# Patient Record
Sex: Male | Born: 1950 | Race: White | Hispanic: No | Marital: Married | Smoking: Current every day smoker
Health system: Southern US, Community
[De-identification: ages and names within clinical notes are randomized; demographics above are authoritative.]

## PROBLEM LIST (undated history)

## (undated) DIAGNOSIS — F419 Anxiety disorder, unspecified: Secondary | ICD-10-CM

## (undated) DIAGNOSIS — N400 Enlarged prostate without lower urinary tract symptoms: Secondary | ICD-10-CM

## (undated) DIAGNOSIS — D649 Anemia, unspecified: Secondary | ICD-10-CM

## (undated) DIAGNOSIS — M1990 Unspecified osteoarthritis, unspecified site: Secondary | ICD-10-CM

---

## 1988-09-27 HISTORY — PX: NASAL SEPTUM SURGERY: SHX37

## 1998-06-04 ENCOUNTER — Ambulatory Visit (HOSPITAL_COMMUNITY): Admission: RE | Admit: 1998-06-04 | Discharge: 1998-06-04 | Payer: Self-pay | Admitting: Family Medicine

## 2002-06-13 ENCOUNTER — Emergency Department (HOSPITAL_COMMUNITY): Admission: EM | Admit: 2002-06-13 | Discharge: 2002-06-13 | Payer: Self-pay | Admitting: Internal Medicine

## 2002-06-13 ENCOUNTER — Encounter: Payer: Self-pay | Admitting: Internal Medicine

## 2003-01-22 ENCOUNTER — Ambulatory Visit (HOSPITAL_COMMUNITY): Admission: RE | Admit: 2003-01-22 | Discharge: 2003-01-22 | Payer: Self-pay | Admitting: Family Medicine

## 2003-01-22 ENCOUNTER — Encounter: Payer: Self-pay | Admitting: Family Medicine

## 2008-03-21 ENCOUNTER — Ambulatory Visit (HOSPITAL_COMMUNITY): Admission: RE | Admit: 2008-03-21 | Discharge: 2008-03-21 | Payer: Self-pay | Admitting: Family Medicine

## 2009-10-28 ENCOUNTER — Ambulatory Visit (HOSPITAL_COMMUNITY): Admission: RE | Admit: 2009-10-28 | Discharge: 2009-10-28 | Payer: Self-pay | Admitting: Family Medicine

## 2009-12-30 ENCOUNTER — Ambulatory Visit (HOSPITAL_COMMUNITY): Admission: RE | Admit: 2009-12-30 | Discharge: 2009-12-30 | Payer: Self-pay | Admitting: Family Medicine

## 2010-08-05 ENCOUNTER — Ambulatory Visit (HOSPITAL_COMMUNITY): Admission: RE | Admit: 2010-08-05 | Discharge: 2010-08-05 | Payer: Self-pay | Admitting: Family Medicine

## 2010-10-19 ENCOUNTER — Encounter: Payer: Self-pay | Admitting: Orthopaedic Surgery

## 2018-01-09 ENCOUNTER — Other Ambulatory Visit (HOSPITAL_COMMUNITY): Payer: Self-pay

## 2018-01-10 ENCOUNTER — Encounter (HOSPITAL_COMMUNITY): Payer: Self-pay

## 2018-01-10 ENCOUNTER — Encounter (HOSPITAL_COMMUNITY)
Admission: RE | Admit: 2018-01-10 | Discharge: 2018-01-10 | Disposition: A | Discharge status: Home / Self Care | Payer: Medicare HMO | Source: Ambulatory Visit | Attending: Family Medicine | Admitting: Family Medicine

## 2018-01-10 DIAGNOSIS — D500 Iron deficiency anemia secondary to blood loss (chronic): Secondary | ICD-10-CM | POA: Insufficient documentation

## 2018-01-10 LAB — ABO/RH: ABO/RH(D): O POS

## 2018-01-10 LAB — PREPARE RBC (CROSSMATCH)

## 2018-01-11 ENCOUNTER — Other Ambulatory Visit (HOSPITAL_COMMUNITY): Payer: Self-pay

## 2018-01-11 ENCOUNTER — Encounter (HOSPITAL_COMMUNITY): Payer: Self-pay

## 2018-01-11 ENCOUNTER — Encounter (HOSPITAL_COMMUNITY)
Admission: RE | Admit: 2018-01-11 | Discharge: 2018-01-11 | Disposition: A | Discharge status: Home / Self Care | Payer: Medicare HMO | Source: Ambulatory Visit | Attending: Family Medicine | Admitting: Family Medicine

## 2018-01-11 DIAGNOSIS — D500 Iron deficiency anemia secondary to blood loss (chronic): Secondary | ICD-10-CM | POA: Diagnosis not present

## 2018-01-11 HISTORY — DX: Unspecified osteoarthritis, unspecified site: M19.90

## 2018-01-11 HISTORY — DX: Anemia, unspecified: D64.9

## 2018-01-11 HISTORY — DX: Benign prostatic hyperplasia without lower urinary tract symptoms: N40.0

## 2018-01-11 HISTORY — DX: Anxiety disorder, unspecified: F41.9

## 2018-01-11 MED ORDER — SODIUM CHLORIDE 0.9 % IV SOLN
Freq: Once | INTRAVENOUS | Status: AC
  Administered 2018-01-11: 09:00:00 via INTRAVENOUS

## 2018-01-11 NOTE — Progress Notes (Signed)
Patient tolerated both units of PRBS without reaction.  No complaints noted.

## 2018-01-12 LAB — BPAM RBC
BLOOD PRODUCT EXPIRATION DATE: 201905032359
Blood Product Expiration Date: 201904162359
Blood Product Expiration Date: 201905162359
ISSUE DATE / TIME: 201904170842
ISSUE DATE / TIME: 201904171055
UNIT TYPE AND RH: 5100
UNIT TYPE AND RH: 5100
Unit Type and Rh: 5100

## 2018-01-12 LAB — TYPE AND SCREEN
ABO/RH(D): O POS
ANTIBODY SCREEN: NEGATIVE
UNIT DIVISION: 0
Unit division: 0
Unit division: 0

## 2018-02-02 ENCOUNTER — Ambulatory Visit (INDEPENDENT_AMBULATORY_CARE_PROVIDER_SITE_OTHER): Payer: Medicare HMO | Admitting: Internal Medicine

## 2018-09-01 ENCOUNTER — Ambulatory Visit (HOSPITAL_COMMUNITY)
Admission: RE | Admit: 2018-09-01 | Discharge: 2018-09-01 | Disposition: A | Discharge status: Home / Self Care | Payer: Medicare HMO | Source: Ambulatory Visit | Attending: Family Medicine | Admitting: Family Medicine

## 2018-09-01 ENCOUNTER — Other Ambulatory Visit (HOSPITAL_COMMUNITY): Payer: Self-pay | Admitting: Family Medicine

## 2018-09-01 ENCOUNTER — Other Ambulatory Visit (HOSPITAL_COMMUNITY)
Admission: RE | Admit: 2018-09-01 | Discharge: 2018-09-01 | Disposition: A | Discharge status: Home / Self Care | Payer: Medicare HMO | Source: Ambulatory Visit | Attending: Family Medicine | Admitting: Family Medicine

## 2018-09-01 DIAGNOSIS — F17200 Nicotine dependence, unspecified, uncomplicated: Secondary | ICD-10-CM | POA: Diagnosis present

## 2018-09-01 DIAGNOSIS — R69 Illness, unspecified: Secondary | ICD-10-CM | POA: Insufficient documentation

## 2018-09-01 LAB — TSH: TSH: 2.683 u[IU]/mL (ref 0.350–4.500)

## 2018-09-01 LAB — COMPREHENSIVE METABOLIC PANEL
ALBUMIN: 3.9 g/dL (ref 3.5–5.0)
ALT: 25 U/L (ref 0–44)
AST: 28 U/L (ref 15–41)
Alkaline Phosphatase: 53 U/L (ref 38–126)
Anion gap: 5 (ref 5–15)
BUN: 11 mg/dL (ref 8–23)
CO2: 31 mmol/L (ref 22–32)
CREATININE: 0.59 mg/dL — AB (ref 0.61–1.24)
Calcium: 8.5 mg/dL — ABNORMAL LOW (ref 8.9–10.3)
Chloride: 98 mmol/L (ref 98–111)
GFR calc Af Amer: >60 mL/min (ref >60)
Glucose, Bld: 91 mg/dL (ref 70–99)
POTASSIUM: 3.7 mmol/L (ref 3.5–5.1)
Sodium: 134 mmol/L — ABNORMAL LOW (ref 135–145)
Total Bilirubin: 0.3 mg/dL (ref 0.3–1.2)
Total Protein: 6.6 g/dL (ref 6.5–8.1)

## 2018-09-04 LAB — CALCITRIOL (1,25 DI-OH VIT D): VIT D 1 25 DIHYDROXY: 26.7 pg/mL (ref 19.9–79.3)

## 2019-07-28 IMAGING — DX DG CHEST 2V
2 series · 2 of 2 positions shown · non-contrast
Comparison: August 05, 2010

CLINICAL DATA: Shortness of breath, intermittent

EXAM:
CHEST - 2 VIEW

[chest pa]
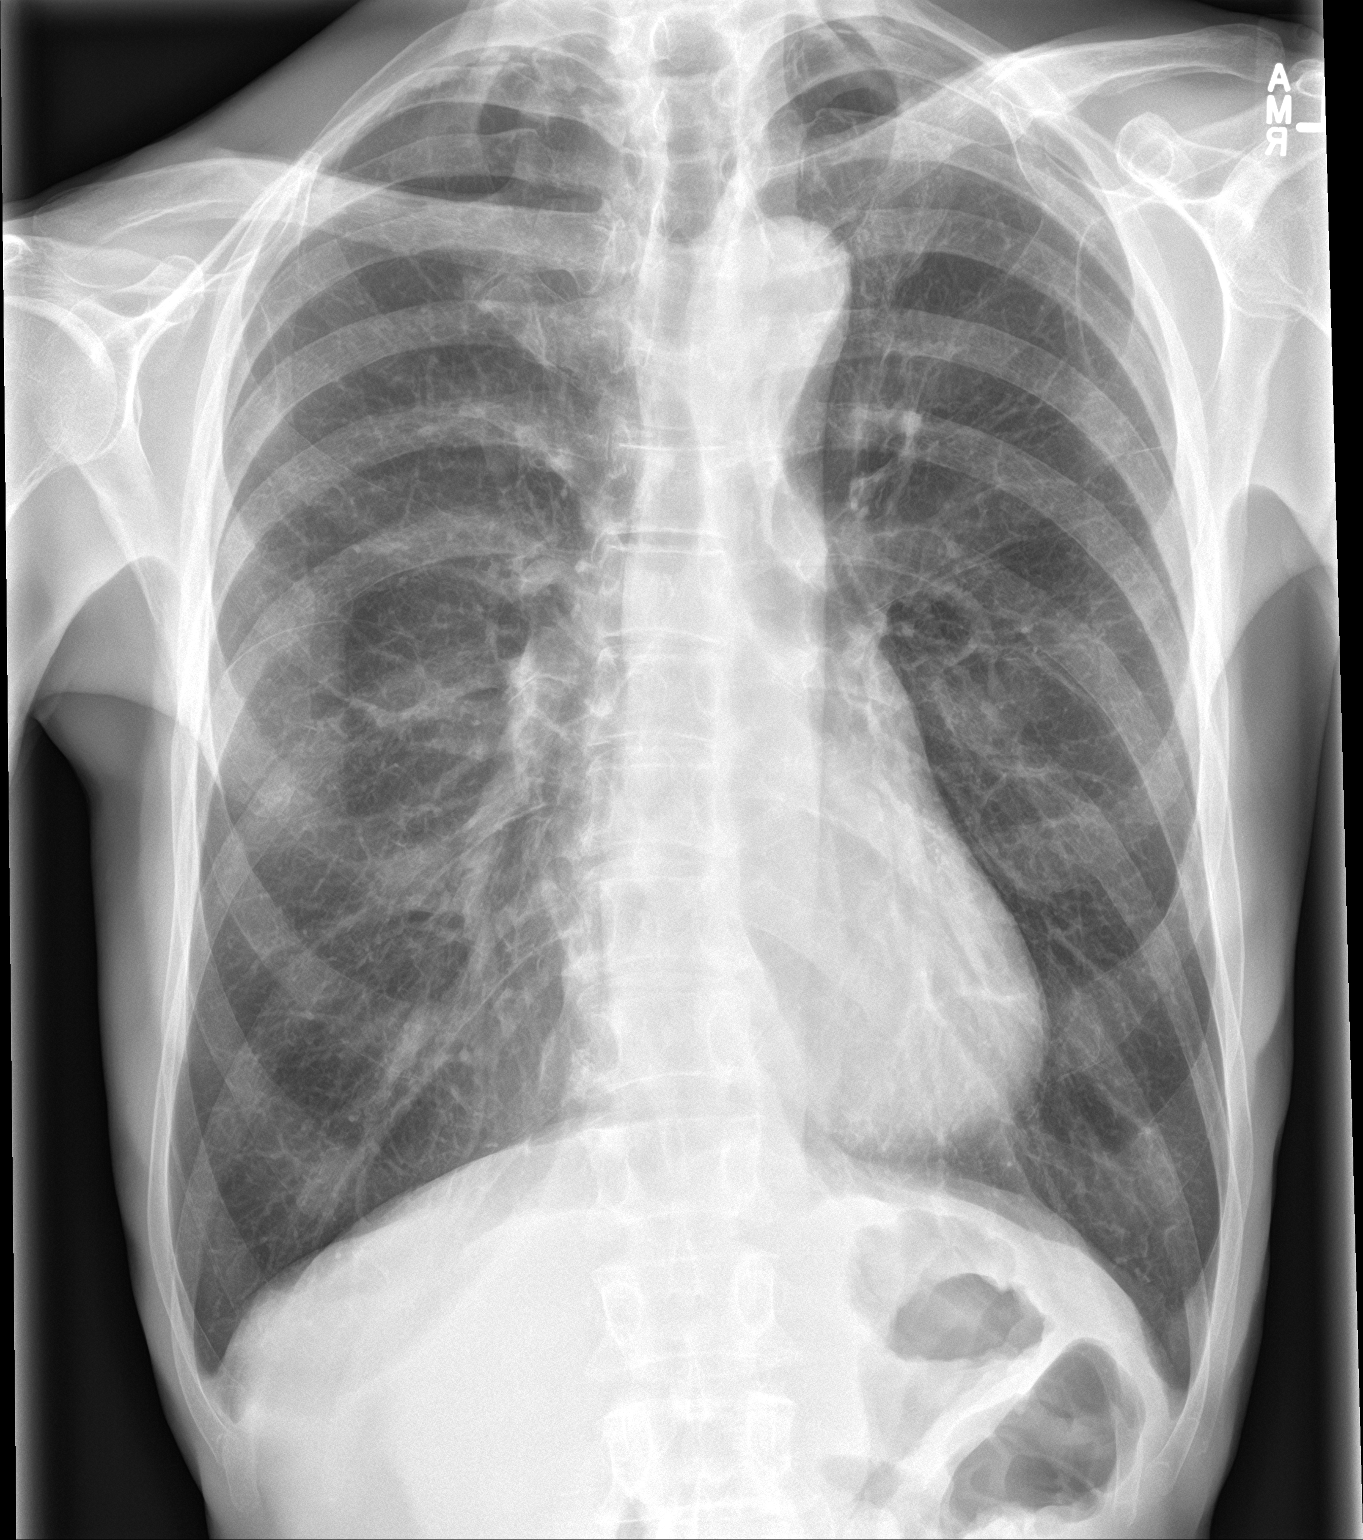

[chest lat]
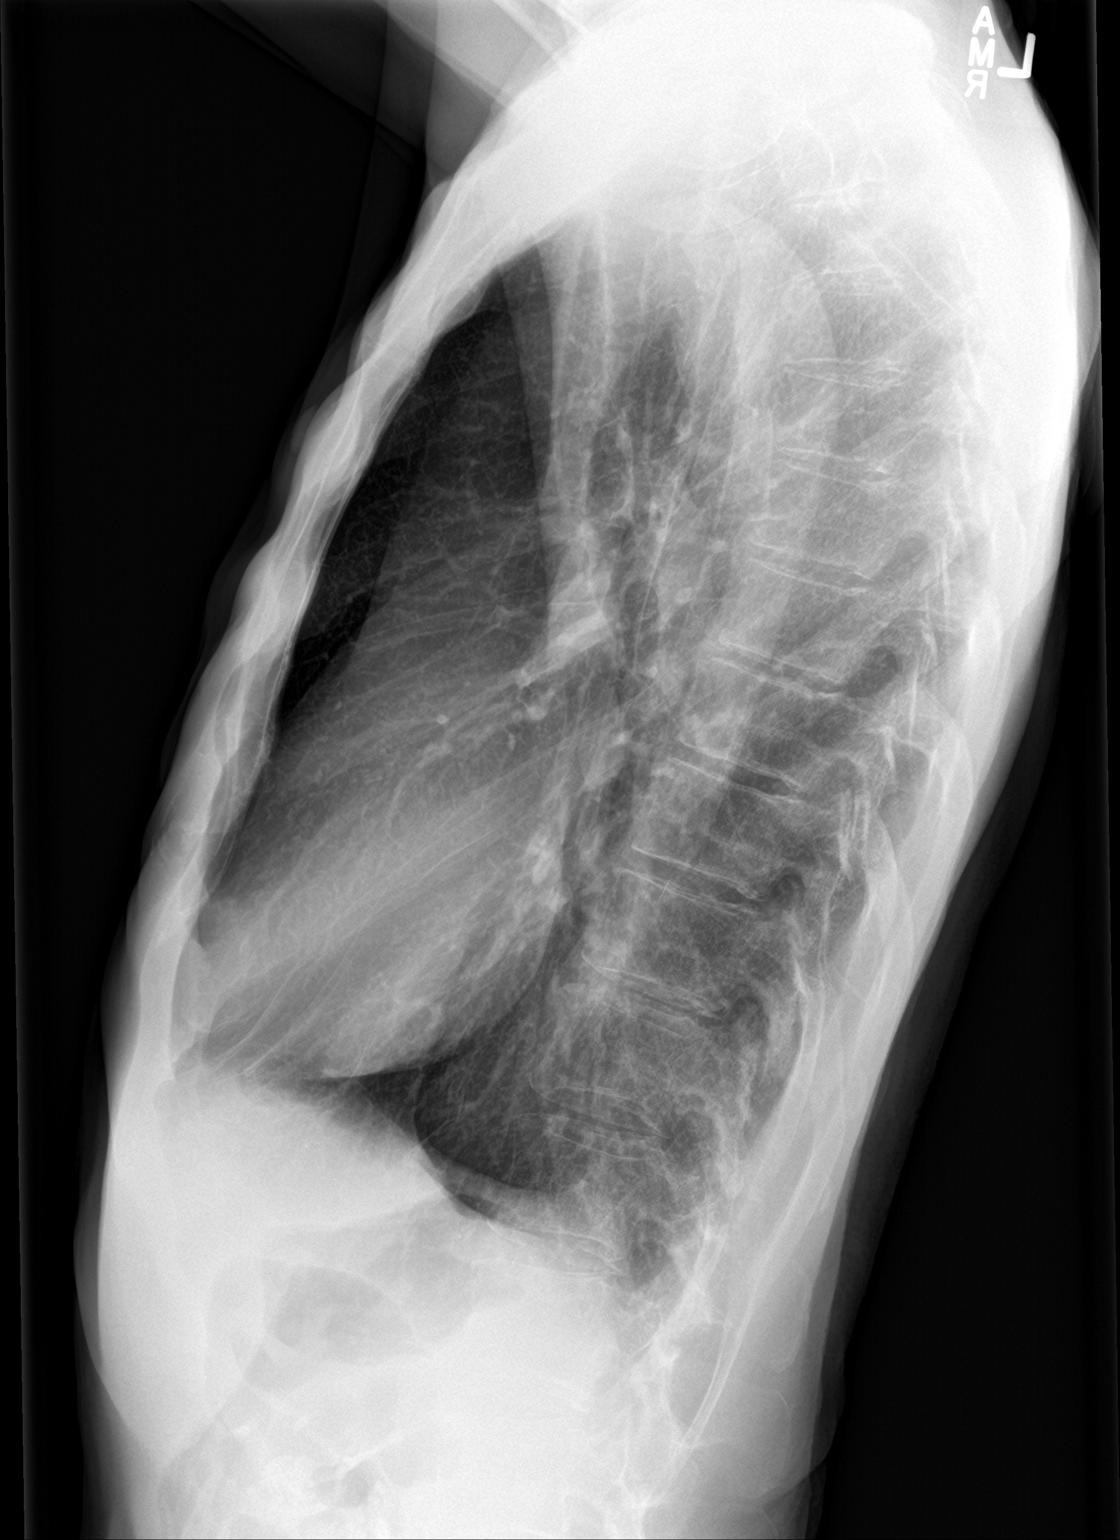

[2 of 2 positions shown; findings below may reference images not displayed]

FINDINGS: Lungs are hyperexpanded with scattered areas of scarring
bilaterally. There is no edema or consolidation. The heart size and
pulmonary vascularity are within normal limits. No adenopathy. There
is aortic atherosclerosis. There is evidence of old healed trauma
involving the mid left clavicle.
IMPRESSION: Underlying hyperexpansion with scattered areas of scarring.
Appearance is consistent with a degree of underlying emphysematous
change. No frank edema or consolidation. Stable cardiac silhouette.

Aortic Atherosclerosis ([id number]-[id number].N) and Emphysema ([id number].G).

## 2019-09-28 DEATH — deceased
